# Patient Record
Sex: Female | Born: 1960 | Race: White | Hispanic: No | Smoking: Never smoker
Health system: Southern US, Community
[De-identification: ages and names within clinical notes are randomized; demographics above are authoritative.]

## PROBLEM LIST (undated history)

## (undated) DIAGNOSIS — C44101 Unspecified malignant neoplasm of skin of unspecified eyelid, including canthus: Secondary | ICD-10-CM

## (undated) DIAGNOSIS — O039 Complete or unspecified spontaneous abortion without complication: Secondary | ICD-10-CM

## (undated) DIAGNOSIS — IMO0002 Reserved for concepts with insufficient information to code with codable children: Secondary | ICD-10-CM

## (undated) DIAGNOSIS — R87619 Unspecified abnormal cytological findings in specimens from cervix uteri: Principal | ICD-10-CM

## (undated) HISTORY — DX: Complete or unspecified spontaneous abortion without complication: O03.9

## (undated) HISTORY — PX: APPENDECTOMY: SHX54

## (undated) HISTORY — DX: Unspecified abnormal cytological findings in specimens from cervix uteri: R87.619

## (undated) HISTORY — DX: Reserved for concepts with insufficient information to code with codable children: IMO0002

## (undated) HISTORY — DX: Unspecified malignant neoplasm of skin of unspecified eyelid, including canthus: C44.101

---

## 2007-11-08 ENCOUNTER — Emergency Department: Payer: Self-pay | Admitting: Emergency Medicine

## 2007-11-09 ENCOUNTER — Emergency Department: Payer: Self-pay | Admitting: Internal Medicine

## 2009-11-26 ENCOUNTER — Ambulatory Visit: Payer: Self-pay | Admitting: Obstetrics and Gynecology

## 2009-12-24 ENCOUNTER — Ambulatory Visit: Payer: Self-pay | Admitting: Obstetrics and Gynecology

## 2010-12-08 ENCOUNTER — Ambulatory Visit (INDEPENDENT_AMBULATORY_CARE_PROVIDER_SITE_OTHER): Payer: Managed Care, Other (non HMO) | Admitting: Obstetrics and Gynecology

## 2010-12-08 DIAGNOSIS — Z01419 Encounter for gynecological examination (general) (routine) without abnormal findings: Secondary | ICD-10-CM

## 2010-12-08 DIAGNOSIS — Z1272 Encounter for screening for malignant neoplasm of vagina: Secondary | ICD-10-CM

## 2010-12-09 NOTE — Assessment & Plan Note (Signed)
Carmen Griffith, MADRID NO.:  [id number]  MEDICAL RECORD NO.:  [id number]          PATIENT TYPE:  LOCATION:  [facility] at [facility]           FACILITY:  PHYSICIAN:  Catalina Antigua, MD          DATE OF BIRTH:  DATE OF SERVICE:  12/08/2010                                 CLINIC NOTE  This is a 50 year old G3, P 1-0-2-1 with LMP of November 23, 2010, who presents today for annual exam.  The patient is currently without any complaints, no abnormal bleeding, discharge, or pelvic pain.  PAST MEDICAL HISTORY:  She denies.  PAST SURGICAL HISTORY:  She has had an appendectomy in 1971.  PAST OBSTETRICAL HISTORY:  She has had one full-term vaginal delivery and two miscarriages.  PAST GYNECOLOGICAL HISTORY:  She denies any cyst, fibroids, or history of abnormal Pap smears.  SOCIAL HISTORY:  She denies drinking, smoking, or use of illicit drugs.  FAMILY HISTORY:  Significant for diabetes and hypertension and heart disease.  No history of malignancy in the family.  She is not currently taking any medications.  Her allergies are to BENADRYL.  REVIEW OF SYSTEMS:  Otherwise within normal limits.  PHYSICAL EXAMINATION:  VITAL SIGNS:  Her blood pressure is 101/72. Pulse of 74, weight of 129 pounds, height of 5 feet 5 inches. LUNGS:  Clear to auscultation bilaterally. HEART:  Regular rate and rhythm. BREASTS:  Equal in size, nontender.  No expressible nipple discharge. No palpable masses or lymphadenopathy.  No skin dimpling. ABDOMEN:  Soft, nontender, nondistended. PELVIC:  Normal-appearing external genitalia and normal-appearing vaginal mucosa and cervix.  No abnormal bleeding or discharge.  Bimanual exam shows small anteverted uterus.  No palpable adnexal masses or tenderness.  ASSESSMENT AND PLAN:  This is a 50 year old who presents today for annual exam.  Pap smear was performed.  A referral for routine screening mammogram was also provided.  The patient will be  contacted with any abnormal results.  The patient was also advised to start taking multivitamins with calcium supplementation.  The patient will return in a year or p.r.n.          ______________________________ Catalina Antigua, MD    PC/MEDQ  D:  12/08/2010  T:  12/09/2010  Job:  [id number]

## 2010-12-22 NOTE — Assessment & Plan Note (Signed)
NAMELAPORSHA, GREALISH NO.:  [id number]   MEDICAL RECORD NO.:  [id number]          PATIENT TYPE:  POB   LOCATION:  [facility] at [facility]         FACILITY:  [facility]   PHYSICIAN:  Catalina Antigua, MD     DATE OF BIRTH:  06/21/1961   DATE OF SERVICE:  11/26/2009                                  CLINIC NOTE   This is a 50 year old G3, P 1-0-2-1 with LMP on November 03, 2009 who  presents today for annual exam and also evaluation of right breast  tenderness.  She has noticed for the past few months.  The patient  denies any abnormal bleeding or discharge or any pelvic pain.   She denies any past medical history.   PAST SURGICAL HISTORY:  She has had an appendectomy in 1971.   PAST OBSTETRICAL HISTORY:  She has had 1 vaginal delivery and 2  miscarriages.   PAST GYNECOLOGIC HISTORY:  She denies any history of abnormal Pap smear,  ovarian cyst, or fibroids or history of STDs.  Her last Pap smear was in  2008 and her last mammogram was also in 2008.  The patient states that  her OB/GYN has recently retired and she has already established herself  with a new Dosia Yodice.   SOCIAL HISTORY:  She denies alcohol abuse, smoking, or the use of  illicit drugs.   FAMILY HISTORY:  Significant for diabetes and heart disease in her  father as well as high blood pressure in her father, otherwise coronary  artery disease in her maternal and paternal grandparents.   She is not currently taking any medication.   She reports an allergy to BENADRYL.   REVIEW OF SYSTEMS:  Otherwise within normal limits.   PHYSICAL EXAMINATION:  VITAL SIGNS:  On physical exam, her blood  pressure is 110/76, pulse of 67, weight of 131 pounds, height of 5 feet  5 inches.  LUNGS:  Clear to auscultation bilaterally.  HEART:  Regular rate and rhythm.  BREASTS:  Equal in size, nontender, no expressible nipple discharge.  No  skin dimpling.  No palpable lymphadenopathy.  No palpable masses on the  left breast,  but palpable multiple nodules on the midline of the right  breast and somewhat consistency to fibrocystic changes.  ABDOMEN:  Soft, nontender, nondistended.  PELVIC:  She had normal-appearing external genitalia, normal-appearing  vagina, and cervix.  No abnormal bleeding or discharge.  Bimanual exam,  small anteverted uterus.  No palpable adnexal masses or tenderness.   ASSESSMENT AND PLAN:  This is a 50 year old G3, P 1-0-2-1, who is here  for annual exam.  Pap smear was performed.  The patient was provided  with a referral for mammogram.  The patient will be contacted with any  abnormal results and will return in a year or p.r.n.           ______________________________  Catalina Antigua, MD     PC/MEDQ  D:  11/26/2009  T:  11/26/2009  Job:  [id number]

## 2011-01-20 ENCOUNTER — Ambulatory Visit: Payer: Self-pay | Admitting: Obstetrics and Gynecology

## 2012-01-18 ENCOUNTER — Ambulatory Visit (INDEPENDENT_AMBULATORY_CARE_PROVIDER_SITE_OTHER): Payer: BC Managed Care – PPO | Admitting: Obstetrics & Gynecology

## 2012-01-18 ENCOUNTER — Encounter: Payer: Self-pay | Admitting: Obstetrics & Gynecology

## 2012-01-18 VITALS — BP 102/71 | HR 62 | Ht 65.0 in | Wt 135.0 lb

## 2012-01-18 DIAGNOSIS — Z124 Encounter for screening for malignant neoplasm of cervix: Secondary | ICD-10-CM

## 2012-01-18 DIAGNOSIS — Z01419 Encounter for gynecological examination (general) (routine) without abnormal findings: Secondary | ICD-10-CM

## 2012-01-18 NOTE — Patient Instructions (Signed)
Preventive Care for Adults, Female A healthy lifestyle and preventive care can promote health and wellness. Preventive health guidelines for women include the following key practices.  A routine yearly physical is a good way to check with your caregiver about your health and preventive screening. It is a chance to share any concerns and updates on your health, and to receive a thorough exam.   Visit your dentist for a routine exam and preventive care every 6 months. Brush your teeth twice a day and floss once a day. Good oral hygiene prevents tooth decay and gum disease.   The frequency of eye exams is based on your age, health, family medical history, use of contact lenses, and other factors. Follow your caregiver's recommendations for frequency of eye exams.   Eat a healthy diet. Foods like vegetables, fruits, whole grains, low-fat dairy products, and lean protein foods contain the nutrients you need without too many calories. Decrease your intake of foods high in solid fats, added sugars, and salt. Eat the right amount of calories for you.Get information about a proper diet from your caregiver, if necessary.   Regular physical exercise is one of the most important things you can do for your health. Most adults should get at least 150 minutes of moderate-intensity exercise (any activity that increases your heart rate and causes you to sweat) each week. In addition, most adults need muscle-strengthening exercises on 2 or more days a week.   Maintain a healthy weight. The body mass index (BMI) is a screening tool to identify possible weight problems. It provides an estimate of body fat based on height and weight. Your caregiver can help determine your BMI, and can help you achieve or maintain a healthy weight.For adults 20 years and older:   A BMI below 18.5 is considered underweight.   A BMI of 18.5 to 24.9 is normal.   A BMI of 25 to 29.9 is considered overweight.   A BMI of 30 and above  is considered obese.   Maintain normal blood lipids and cholesterol levels by exercising and minimizing your intake of saturated fat. Eat a balanced diet with plenty of fruit and vegetables. Blood tests for lipids and cholesterol should begin at age 20 and be repeated every 5 years. If your lipid or cholesterol levels are high, you are over 50, or you are at high risk for heart disease, you may need your cholesterol levels checked more frequently.Ongoing high lipid and cholesterol levels should be treated with medicines if diet and exercise are not effective.   If you smoke, find out from your caregiver how to quit. If you do not use tobacco, do not start.   If you are pregnant, do not drink alcohol. If you are breastfeeding, be very cautious about drinking alcohol. If you are not pregnant and choose to drink alcohol, do not exceed 1 drink per day. One drink is considered to be 12 ounces (355 mL) of beer, 5 ounces (148 mL) of wine, or 1.5 ounces (44 mL) of liquor.   Avoid use of street drugs. Do not share needles with anyone. Ask for help if you need support or instructions about stopping the use of drugs.   High blood pressure causes heart disease and increases the risk of stroke. Your blood pressure should be checked at least every 1 to 2 years. Ongoing high blood pressure should be treated with medicines if weight loss and exercise are not effective.   If you are 55 to 49   years old, ask your caregiver if you should take aspirin to prevent strokes.   Diabetes screening involves taking a blood sample to check your fasting blood sugar level. This should be done once every 3 years, after age 45, if you are within normal weight and without risk factors for diabetes. Testing should be considered at a younger age or be carried out more frequently if you are overweight and have at least 1 risk factor for diabetes.   Breast cancer screening is essential preventive care for women. You should practice  "breast self-awareness." This means understanding the normal appearance and feel of your breasts and may include breast self-examination. Any changes detected, no matter how small, should be reported to a caregiver. Women in their 20s and 30s should have a clinical breast exam (CBE) by a caregiver as part of a regular health exam every 1 to 3 years. After age 40, women should have a CBE every year. Starting at age 40, women should consider having a mammography (breast X-ray test) every year. Women who have a family history of breast cancer should talk to their caregiver about genetic screening. Women at a high risk of breast cancer should talk to their caregivers about having magnetic resonance imaging (MRI) and a mammography every year.   The Pap test is a screening test for cervical cancer. A Pap test can show cell changes on the cervix that might become cervical cancer if left untreated. A Pap test is a procedure in which cells are obtained and examined from the lower end of the uterus (cervix).   Women should have a Pap test starting at age 21.   Between ages 21 and 29, Pap tests should be repeated every 2 years.   Beginning at age 30, you should have a Pap test every 3 years as long as the past 3 Pap tests have been normal.   Some women have medical problems that increase the chance of getting cervical cancer. Talk to your caregiver about these problems. It is especially important to talk to your caregiver if a new problem develops soon after your last Pap test. In these cases, your caregiver may recommend more frequent screening and Pap tests.   The above recommendations are the same for women who have or have not gotten the vaccine for human papillomavirus (HPV).   If you had a hysterectomy for a problem that was not cancer or a condition that could lead to cancer, then you no longer need Pap tests. Even if you no longer need a Pap test, a regular exam is a good idea to make sure no other  problems are starting.   If you are between ages 65 and 70, and you have had normal Pap tests going back 10 years, you no longer need Pap tests. Even if you no longer need a Pap test, a regular exam is a good idea to make sure no other problems are starting.   If you have had past treatment for cervical cancer or a condition that could lead to cancer, you need Pap tests and screening for cancer for at least 20 years after your treatment.   If Pap tests have been discontinued, risk factors (such as a new sexual partner) need to be reassessed to determine if screening should be resumed.   The HPV test is an additional test that may be used for cervical cancer screening. The HPV test looks for the virus that can cause the cell changes on the cervix.   The cells collected during the Pap test can be tested for HPV. The HPV test could be used to screen women aged 30 years and older, and should be used in women of any age who have unclear Pap test results. After the age of 30, women should have HPV testing at the same frequency as a Pap test.   Colorectal cancer can be detected and often prevented. Most routine colorectal cancer screening begins at the age of 50 and continues through age 75. However, your caregiver may recommend screening at an earlier age if you have risk factors for colon cancer. On a yearly basis, your caregiver may provide home test kits to check for hidden blood in the stool. Use of a small camera at the end of a tube, to directly examine the colon (sigmoidoscopy or colonoscopy), can detect the earliest forms of colorectal cancer. Talk to your caregiver about this at age 50, when routine screening begins. Direct examination of the colon should be repeated every 5 to 10 years through age 75, unless early forms of pre-cancerous polyps or small growths are found.   Hepatitis C blood testing is recommended for all people born from 1945 through 1965 and any individual with known risks for  hepatitis C.   Practice safe sex. Use condoms and avoid high-risk sexual practices to reduce the spread of sexually transmitted infections (STIs). STIs include gonorrhea, chlamydia, syphilis, trichomonas, herpes, HPV, and human immunodeficiency virus (HIV). Herpes, HIV, and HPV are viral illnesses that have no cure. They can result in disability, cancer, and death. Sexually active women aged 25 and younger should be checked for chlamydia. Older women with new or multiple partners should also be tested for chlamydia. Testing for other STIs is recommended if you are sexually active and at increased risk.   Osteoporosis is a disease in which the bones lose minerals and strength with aging. This can result in serious bone fractures. The risk of osteoporosis can be identified using a bone density scan. Women ages 65 and over and women at risk for fractures or osteoporosis should discuss screening with their caregivers. Ask your caregiver whether you should take a calcium supplement or vitamin D to reduce the rate of osteoporosis.   Menopause can be associated with physical symptoms and risks. Hormone replacement therapy is available to decrease symptoms and risks. You should talk to your caregiver about whether hormone replacement therapy is right for you.   Use sunscreen with sun protection factor (SPF) of 30 or more. Apply sunscreen liberally and repeatedly throughout the day. You should seek shade when your shadow is shorter than you. Protect yourself by wearing long sleeves, pants, a wide-brimmed hat, and sunglasses year round, whenever you are outdoors.   Once a month, do a whole body skin exam, using a mirror to look at the skin on your back. Notify your caregiver of new moles, moles that have irregular borders, moles that are larger than a pencil eraser, or moles that have changed in shape or color.   Stay current with required immunizations.   Influenza. You need a dose every fall (or winter). The  composition of the flu vaccine changes each year, so being vaccinated once is not enough.   Pneumococcal polysaccharide. You need 1 to 2 doses if you smoke cigarettes or if you have certain chronic medical conditions. You need 1 dose at age 65 (or older) if you have never been vaccinated.   Tetanus, diphtheria, pertussis (Tdap, Td). Get 1 dose of   Tdap vaccine if you are younger than age 65, are over 65 and have contact with an infant, are a healthcare worker, are pregnant, or simply want to be protected from whooping cough. After that, you need a Td booster dose every 10 years. Consult your caregiver if you have not had at least 3 tetanus and diphtheria-containing shots sometime in your life or have a deep or dirty wound.   HPV. You need this vaccine if you are a woman age 26 or younger. The vaccine is given in 3 doses over 6 months.   Measles, mumps, rubella (MMR). You need at least 1 dose of MMR if you were born in 1957 or later. You may also need a second dose.   Meningococcal. If you are age 19 to 21 and a first-year college student living in a residence hall, or have one of several medical conditions, you need to get vaccinated against meningococcal disease. You may also need additional booster doses.   Zoster (shingles). If you are age 60 or older, you should get this vaccine.   Varicella (chickenpox). If you have never had chickenpox or you were vaccinated but received only 1 dose, talk to your caregiver to find out if you need this vaccine.   Hepatitis A. You need this vaccine if you have a specific risk factor for hepatitis A virus infection or you simply wish to be protected from this disease. The vaccine is usually given as 2 doses, 6 to 18 months apart.   Hepatitis B. You need this vaccine if you have a specific risk factor for hepatitis B virus infection or you simply wish to be protected from this disease. The vaccine is given in 3 doses, usually over 6 months.  Preventive Services /  Frequency Ages 40 to 64  Blood pressure check.** / Every 1 to 2 years.   Lipid and cholesterol check.** / Every 5 years beginning at age 20.   Clinical breast exam.** / Every year after age 40.   Mammogram.** / Every year beginning at age 40 and continuing for as long as you are in good health. Consult with your caregiver.   Pap test.** / Every 3 years starting at age 30 through age 65 or 70 with a history of 3 consecutive normal Pap tests.   HPV screening.** / Every 3 years from ages 30 through ages 65 to 70 with a history of 3 consecutive normal Pap tests.   Fecal occult blood test (FOBT) of stool. / Every year beginning at age 50 and continuing until age 75. You may not need to do this test if you get a colonoscopy every 10 years.   Flexible sigmoidoscopy or colonoscopy.** / Every 5 years for a flexible sigmoidoscopy or every 10 years for a colonoscopy beginning at age 50 and continuing until age 75.   Hepatitis C blood test.** / For all people born from 1945 through 1965 and any individual with known risks for hepatitis C.   Skin self-exam. / Monthly.   Influenza immunization.** / Every year.   Pneumococcal polysaccharide immunization.** / 1 to 2 doses if you smoke cigarettes or if you have certain chronic medical conditions.   Tetanus, diphtheria, pertussis (Tdap, Td) immunization.** / A one-time dose of Tdap vaccine. After that, you need a Td booster dose every 10 years.   Measles, mumps, rubella (MMR) immunization. / You need at least 1 dose of MMR if you were born in 1957 or later. You may also need a   second dose.   Varicella immunization.** / Consult your caregiver.   Meningococcal immunization.** / Consult your caregiver.   Hepatitis A immunization.** / Consult your caregiver. 2 doses, 6 to 18 months apart.   Hepatitis B immunization.** / Consult your caregiver. 3 doses, usually over 6 months.  ** Family history and personal history of risk and conditions may change  your caregiver's recommendations. Document Released: 09/21/2001 Document Revised: 07/15/2011 Document Reviewed: 12/21/2010 [employer] Patient Information 2012 [employer]. 

## 2012-01-18 NOTE — Progress Notes (Signed)
  Subjective:     Carmen Griffith is a 51 y.o. G3, P 1-0-2-1 who presents today for annual exam.  The patient denies any abnormal bleeding or discharge or any pelvic pain.   She denies any past medical history.   PAST SURGICAL HISTORY: She has had an appendectomy in 1971.   PAST OBSTETRICAL HISTORY: She has had 1 vaginal delivery and 2 miscarriages.   PAST GYNECOLOGIC HISTORY: She denies any history of abnormal Pap smear, ovarian cyst, or fibroids or history of STDs.   SOCIAL HISTORY: She denies alcohol abuse, smoking, or the use of  illicit drugs.   FAMILY HISTORY: Significant for diabetes and heart disease in her father as well as high blood pressure in her father, otherwise coronary  artery disease in her maternal and paternal grandparents.   She is currently taking vitamins.  She reports an allergy to BENADRYL.   REVIEW OF SYSTEMS: Otherwise within normal limits.  The following portions of the patient's history were reviewed and updated as appropriate: allergies, current medications, past family history, past medical history, past social history, past surgical history and problem list.   Objective:   Blood pressure 102/71, pulse 62, height 5\' 5"  (1.651 m), weight 135 lb (61.236 kg), last menstrual period 01/14/2012. GENERAL: Well-developed, well-nourished female in no acute distress.  HEENT: Normocephalic, atraumatic. Sclerae anicteric.  NECK: Supple. Normal thyroid.  LUNGS: Clear to auscultation bilaterally.  HEART: Regular rate and rhythm. BREASTS: Symmetric with everted nipples. No masses, skin changes, nipple drainage, or lymphadenopathy. ABDOMEN: Soft, nontender, nondistended. No organomegaly. PELVIC: Normal external female genitalia. Vagina is pink and rugated. Small amount of blood in vault, patient is at the end of her menstrual period. Normal cervix contour. Pap smear obtained. Uterus is normal in size. No adnexal mass or tenderness.  EXTREMITIES: No cyanosis,  clubbing, or edema, 2+ distal pulses.   Assessment:    Healthy female exam.      Plan:    Follow up pap smear. Mammogram will be scheduled. Patient to return for lipid panel check Other preventative health maintenance issues addressed.

## 2012-01-18 NOTE — Progress Notes (Signed)
Patient is here for routine exam, no complaints.

## 2012-01-24 ENCOUNTER — Other Ambulatory Visit (INDEPENDENT_AMBULATORY_CARE_PROVIDER_SITE_OTHER): Payer: BC Managed Care – PPO | Admitting: Gynecology

## 2012-01-24 DIAGNOSIS — Z0000 Encounter for general adult medical examination without abnormal findings: Secondary | ICD-10-CM

## 2012-01-24 LAB — CBC
Hemoglobin: 13.7 g/dL (ref 12.0–15.0)
MCH: 31.4 pg (ref 26.0–34.0)
Platelets: 323 10*3/uL (ref 150–400)
RBC: 4.36 MIL/uL (ref 3.87–5.11)
WBC: 5.2 10*3/uL (ref 4.0–10.5)

## 2012-01-24 LAB — COMPREHENSIVE METABOLIC PANEL WITH GFR
ALT: 15 U/L (ref 0–35)
AST: 18 U/L (ref 0–37)
Albumin: 4 g/dL (ref 3.5–5.2)
Alkaline Phosphatase: 68 U/L (ref 39–117)
BUN: 15 mg/dL (ref 6–23)
CO2: 26 meq/L (ref 19–32)
Calcium: 8.9 mg/dL (ref 8.4–10.5)
Chloride: 106 meq/L (ref 96–112)
Creat: 0.78 mg/dL (ref 0.50–1.10)
Glucose, Bld: 88 mg/dL (ref 70–99)
Potassium: 4.8 meq/L (ref 3.5–5.3)
Sodium: 140 meq/L (ref 135–145)
Total Bilirubin: 0.5 mg/dL (ref 0.3–1.2)
Total Protein: 6.7 g/dL (ref 6.0–8.3)

## 2012-01-24 LAB — TSH: TSH: 1.98 u[IU]/mL (ref 0.350–4.500)

## 2012-01-24 LAB — LIPID PANEL
Cholesterol: 182 mg/dL (ref 0–200)
HDL: 64 mg/dL
LDL Cholesterol: 101 mg/dL — ABNORMAL HIGH (ref 0–99)
Total CHOL/HDL Ratio: 2.8 ratio
Triglycerides: 83 mg/dL
VLDL: 17 mg/dL (ref 0–40)

## 2012-02-16 ENCOUNTER — Ambulatory Visit: Payer: Self-pay | Admitting: Obstetrics & Gynecology

## 2012-02-16 ENCOUNTER — Encounter: Payer: Self-pay | Admitting: Obstetrics & Gynecology

## 2013-03-28 ENCOUNTER — Ambulatory Visit (INDEPENDENT_AMBULATORY_CARE_PROVIDER_SITE_OTHER): Payer: BC Managed Care – PPO | Admitting: Obstetrics and Gynecology

## 2013-03-28 ENCOUNTER — Encounter: Payer: Self-pay | Admitting: Obstetrics and Gynecology

## 2013-03-28 VITALS — BP 118/69 | HR 62 | Ht 65.0 in | Wt 132.0 lb

## 2013-03-28 DIAGNOSIS — Z01419 Encounter for gynecological examination (general) (routine) without abnormal findings: Secondary | ICD-10-CM

## 2013-03-28 DIAGNOSIS — Z124 Encounter for screening for malignant neoplasm of cervix: Secondary | ICD-10-CM

## 2013-03-28 DIAGNOSIS — Z1151 Encounter for screening for human papillomavirus (HPV): Secondary | ICD-10-CM

## 2013-03-28 NOTE — Progress Notes (Signed)
  Subjective:     Carmen Griffith is a 52 y.o. female [phone] with LMP 03/22/2013 and BMI 21 who is here for a comprehensive physical exam. The patient reports no problems. Patient continues to have regular cycles. She is sexually active without issues using vasectomy for contraception. Patient is enrolled in exercise regimen.  History   Social History  . Marital Status: Married    Spouse Name: N/A    Number of Children: N/A  . Years of Education: N/A   Occupational History  . Not on file.   Social History Main Topics  . Smoking status: Never Smoker   . Smokeless tobacco: Never Used  . Alcohol Use: No  . Drug Use: No  . Sexual Activity: Yes    Partners: Male   Other Topics Concern  . Not on file   Social History Narrative  . No narrative on file   Health Maintenance  Topic Date Due  . Tetanus/tdap  08/31/1981  . Mammogram  08/31/2012  . Colonoscopy  08/31/2012  . Influenza Vaccine  04/09/2013  . Pap Smear  01/18/2015   Past Medical History  Diagnosis Date  . Miscarriage   . Abnormal Pap smear    Past Surgical History  Procedure Laterality Date  . Appendectomy     Family History  Problem Relation Age of Onset  . Diabetes Father   . Hypertension Father   . Heart disease Maternal Grandfather   . Heart disease Paternal Grandfather    History  Substance Use Topics  . Smoking status: Never Smoker   . Smokeless tobacco: Never Used  . Alcohol Use: No       Review of Systems A comprehensive review of systems was negative.   Objective:      GENERAL: Well-developed, well-nourished female in no acute distress.  HEENT: Normocephalic, atraumatic. Sclerae anicteric.  NECK: Supple. Normal thyroid.  LUNGS: Clear to auscultation bilaterally.  HEART: Regular rate and rhythm. BREASTS: Symmetric in size. No palpable masses or lymphadenopathy, skin changes, or nipple drainage. ABDOMEN: Soft, nontender, nondistended. No organomegaly. PELVIC: Normal external female  genitalia. Vagina is pink and rugated.  Normal discharge. Normal appearing cervix. Uterus is normal in size. No adnexal mass or tenderness. EXTREMITIES: No cyanosis, clubbing, or edema, 2+ distal pulses.    Assessment:    Healthy female exam.      Plan:    Pap smear collected Mammogram referral provided Patient advised to perform monthly self breast and vulva exams Patient will be informed of any abnormal results RTC in 1 year or prn See After Visit Summary for Counseling Recommendations

## 2013-03-28 NOTE — Patient Instructions (Signed)
Preventive Care for Adults, Female A healthy lifestyle and preventive care can promote health and wellness. Preventive health guidelines for women include the following key practices.  A routine yearly physical is a good way to check with your caregiver about your health and preventive screening. It is a chance to share any concerns and updates on your health, and to receive a thorough exam.  Visit your dentist for a routine exam and preventive care every 6 months. Brush your teeth twice a day and floss once a day. Good oral hygiene prevents tooth decay and gum disease.  The frequency of eye exams is based on your age, health, family medical history, use of contact lenses, and other factors. Follow your caregiver's recommendations for frequency of eye exams.  Eat a healthy diet. Foods like vegetables, fruits, whole grains, low-fat dairy products, and lean protein foods contain the nutrients you need without too many calories. Decrease your intake of foods high in solid fats, added sugars, and salt. Eat the right amount of calories for you.Get information about a proper diet from your caregiver, if necessary.  Regular physical exercise is one of the most important things you can do for your health. Most adults should get at least 150 minutes of moderate-intensity exercise (any activity that increases your heart rate and causes you to sweat) each week. In addition, most adults need muscle-strengthening exercises on 2 or more days a week.  Maintain a healthy weight. The body mass index (BMI) is a screening tool to identify possible weight problems. It provides an estimate of body fat based on height and weight. Your caregiver can help determine your BMI, and can help you achieve or maintain a healthy weight.For adults 20 years and older:  A BMI below 18.5 is considered underweight.  A BMI of 18.5 to 24.9 is normal.  A BMI of 25 to 29.9 is considered overweight.  A BMI of 30 and above is  considered obese.  Maintain normal blood lipids and cholesterol levels by exercising and minimizing your intake of saturated fat. Eat a balanced diet with plenty of fruit and vegetables. Blood tests for lipids and cholesterol should begin at age 20 and be repeated every 5 years. If your lipid or cholesterol levels are high, you are over 50, or you are at high risk for heart disease, you may need your cholesterol levels checked more frequently.Ongoing high lipid and cholesterol levels should be treated with medicines if diet and exercise are not effective.  If you smoke, find out from your caregiver how to quit. If you do not use tobacco, do not start.  If you are pregnant, do not drink alcohol. If you are breastfeeding, be very cautious about drinking alcohol. If you are not pregnant and choose to drink alcohol, do not exceed 1 drink per day. One drink is considered to be 12 ounces (355 mL) of beer, 5 ounces (148 mL) of wine, or 1.5 ounces (44 mL) of liquor.  Avoid use of street drugs. Do not share needles with anyone. Ask for help if you need support or instructions about stopping the use of drugs.  High blood pressure causes heart disease and increases the risk of stroke. Your blood pressure should be checked at least every 1 to 2 years. Ongoing high blood pressure should be treated with medicines if weight loss and exercise are not effective.  If you are 55 to 50 years old, ask your caregiver if you should take aspirin to prevent strokes.  Diabetes   screening involves taking a blood sample to check your fasting blood sugar level. This should be done once every 3 years, after age 45, if you are within normal weight and without risk factors for diabetes. Testing should be considered at a younger age or be carried out more frequently if you are overweight and have at least 1 risk factor for diabetes.  Breast cancer screening is essential preventive care for women. You should practice "breast  self-awareness." This means understanding the normal appearance and feel of your breasts and may include breast self-examination. Any changes detected, no matter how small, should be reported to a caregiver. Women in their 20s and 30s should have a clinical breast exam (CBE) by a caregiver as part of a regular health exam every 1 to 3 years. After age 40, women should have a CBE every year. Starting at age 40, women should consider having a mammography (breast X-ray test) every year. Women who have a family history of breast cancer should talk to their caregiver about genetic screening. Women at a high risk of breast cancer should talk to their caregivers about having magnetic resonance imaging (MRI) and a mammography every year.  The Pap test is a screening test for cervical cancer. A Pap test can show cell changes on the cervix that might become cervical cancer if left untreated. A Pap test is a procedure in which cells are obtained and examined from the lower end of the uterus (cervix).  Women should have a Pap test starting at age 21.  Between ages 21 and 29, Pap tests should be repeated every 2 years.  Beginning at age 30, you should have a Pap test every 3 years as long as the past 3 Pap tests have been normal.  Some women have medical problems that increase the chance of getting cervical cancer. Talk to your caregiver about these problems. It is especially important to talk to your caregiver if a new problem develops soon after your last Pap test. In these cases, your caregiver may recommend more frequent screening and Pap tests.  The above recommendations are the same for women who have or have not gotten the vaccine for human papillomavirus (HPV).  If you had a hysterectomy for a problem that was not cancer or a condition that could lead to cancer, then you no longer need Pap tests. Even if you no longer need a Pap test, a regular exam is a good idea to make sure no other problems are  starting.  If you are between ages 65 and 70, and you have had normal Pap tests going back 10 years, you no longer need Pap tests. Even if you no longer need a Pap test, a regular exam is a good idea to make sure no other problems are starting.  If you have had past treatment for cervical cancer or a condition that could lead to cancer, you need Pap tests and screening for cancer for at least 20 years after your treatment.  If Pap tests have been discontinued, risk factors (such as a new sexual partner) need to be reassessed to determine if screening should be resumed.  The HPV test is an additional test that may be used for cervical cancer screening. The HPV test looks for the virus that can cause the cell changes on the cervix. The cells collected during the Pap test can be tested for HPV. The HPV test could be used to screen women aged 30 years and older, and should   be used in women of any age who have unclear Pap test results. After the age of 30, women should have HPV testing at the same frequency as a Pap test.  Colorectal cancer can be detected and often prevented. Most routine colorectal cancer screening begins at the age of 50 and continues through age 75. However, your caregiver may recommend screening at an earlier age if you have risk factors for colon cancer. On a yearly basis, your caregiver may provide home test kits to check for hidden blood in the stool. Use of a small camera at the end of a tube, to directly examine the colon (sigmoidoscopy or colonoscopy), can detect the earliest forms of colorectal cancer. Talk to your caregiver about this at age 50, when routine screening begins. Direct examination of the colon should be repeated every 5 to 10 years through age 75, unless early forms of pre-cancerous polyps or small growths are found.  Hepatitis C blood testing is recommended for all people born from 1945 through 1965 and any individual with known risks for hepatitis C.  Practice  safe sex. Use condoms and avoid high-risk sexual practices to reduce the spread of sexually transmitted infections (STIs). STIs include gonorrhea, chlamydia, syphilis, trichomonas, herpes, HPV, and human immunodeficiency virus (HIV). Herpes, HIV, and HPV are viral illnesses that have no cure. They can result in disability, cancer, and death. Sexually active women aged 25 and younger should be checked for chlamydia. Older women with new or multiple partners should also be tested for chlamydia. Testing for other STIs is recommended if you are sexually active and at increased risk.  Osteoporosis is a disease in which the bones lose minerals and strength with aging. This can result in serious bone fractures. The risk of osteoporosis can be identified using a bone density scan. Women ages 65 and over and women at risk for fractures or osteoporosis should discuss screening with their caregivers. Ask your caregiver whether you should take a calcium supplement or vitamin D to reduce the rate of osteoporosis.  Menopause can be associated with physical symptoms and risks. Hormone replacement therapy is available to decrease symptoms and risks. You should talk to your caregiver about whether hormone replacement therapy is right for you.  Use sunscreen with sun protection factor (SPF) of 30 or more. Apply sunscreen liberally and repeatedly throughout the day. You should seek shade when your shadow is shorter than you. Protect yourself by wearing long sleeves, pants, a wide-brimmed hat, and sunglasses year round, whenever you are outdoors.  Once a month, do a whole body skin exam, using a mirror to look at the skin on your back. Notify your caregiver of new moles, moles that have irregular borders, moles that are larger than a pencil eraser, or moles that have changed in shape or color.  Stay current with required immunizations.  Influenza. You need a dose every fall (or winter). The composition of the flu vaccine  changes each year, so being vaccinated once is not enough.  Pneumococcal polysaccharide. You need 1 to 2 doses if you smoke cigarettes or if you have certain chronic medical conditions. You need 1 dose at age 65 (or older) if you have never been vaccinated.  Tetanus, diphtheria, pertussis (Tdap, Td). Get 1 dose of Tdap vaccine if you are younger than age 65, are over 65 and have contact with an infant, are a healthcare worker, are pregnant, or simply want to be protected from whooping cough. After that, you need a Td   booster dose every 10 years. Consult your caregiver if you have not had at least 3 tetanus and diphtheria-containing shots sometime in your life or have a deep or dirty wound.  HPV. You need this vaccine if you are a woman age 26 or younger. The vaccine is given in 3 doses over 6 months.  Measles, mumps, rubella (MMR). You need at least 1 dose of MMR if you were born in 1957 or later. You may also need a second dose.  Meningococcal. If you are age 19 to 21 and a first-year college student living in a residence hall, or have one of several medical conditions, you need to get vaccinated against meningococcal disease. You may also need additional booster doses.  Zoster (shingles). If you are age 60 or older, you should get this vaccine.  Varicella (chickenpox). If you have never had chickenpox or you were vaccinated but received only 1 dose, talk to your caregiver to find out if you need this vaccine.  Hepatitis A. You need this vaccine if you have a specific risk factor for hepatitis A virus infection or you simply wish to be protected from this disease. The vaccine is usually given as 2 doses, 6 to 18 months apart.  Hepatitis B. You need this vaccine if you have a specific risk factor for hepatitis B virus infection or you simply wish to be protected from this disease. The vaccine is given in 3 doses, usually over 6 months. Preventive Services / Frequency Ages 40 to 64  Blood  pressure check.** / Every 1 to 2 years.  Lipid and cholesterol check.** / Every 5 years beginning at age 20.  Clinical breast exam.** / Every year after age 40.  Mammogram.** / Every year beginning at age 40 and continuing for as long as you are in good health. Consult with your caregiver.  Pap test.** / Every 3 years starting at age 30 through age 65 or 70 with a history of 3 consecutive normal Pap tests.  HPV screening.** / Every 3 years from ages 30 through ages 65 to 70 with a history of 3 consecutive normal Pap tests.  Fecal occult blood test (FOBT) of stool. / Every year beginning at age 50 and continuing until age 75. You may not need to do this test if you get a colonoscopy every 10 years.  Flexible sigmoidoscopy or colonoscopy.** / Every 5 years for a flexible sigmoidoscopy or every 10 years for a colonoscopy beginning at age 50 and continuing until age 75.  Hepatitis C blood test.** / For all people born from 1945 through 1965 and any individual with known risks for hepatitis C.  Skin self-exam. / Monthly.  Influenza immunization.** / Every year.  Pneumococcal polysaccharide immunization.** / 1 to 2 doses if you smoke cigarettes or if you have certain chronic medical conditions.  Tetanus, diphtheria, pertussis (Tdap, Td) immunization.** / A one-time dose of Tdap vaccine. After that, you need a Td booster dose every 10 years.  Measles, mumps, rubella (MMR) immunization. / You need at least 1 dose of MMR if you were born in 1957 or later. You may also need a second dose.  Varicella immunization.** / Consult your caregiver.  Meningococcal immunization.** / Consult your caregiver.  Hepatitis A immunization.** / Consult your caregiver. 2 doses, 6 to 18 months apart.  Hepatitis B immunization.** / Consult your caregiver. 3 doses, usually over 6 months. Ages 65 and over  Blood pressure check.** / Every 1 to 2 years.    Lipid and cholesterol check.** / Every 5 years beginning  at age 20.  Clinical breast exam.** / Every year after age 40.  Mammogram.** / Every year beginning at age 40 and continuing for as long as you are in good health. Consult with your caregiver.  Pap test.** / Every 3 years starting at age 30 through age 65 or 70 with a 3 consecutive normal Pap tests. Testing can be stopped between 65 and 70 with 3 consecutive normal Pap tests and no abnormal Pap or HPV tests in the past 10 years.  HPV screening.** / Every 3 years from ages 30 through ages 65 or 70 with a history of 3 consecutive normal Pap tests. Testing can be stopped between 65 and 70 with 3 consecutive normal Pap tests and no abnormal Pap or HPV tests in the past 10 years.  Fecal occult blood test (FOBT) of stool. / Every year beginning at age 50 and continuing until age 75. You may not need to do this test if you get a colonoscopy every 10 years.  Flexible sigmoidoscopy or colonoscopy.** / Every 5 years for a flexible sigmoidoscopy or every 10 years for a colonoscopy beginning at age 50 and continuing until age 75.  Hepatitis C blood test.** / For all people born from 1945 through 1965 and any individual with known risks for hepatitis C.  Osteoporosis screening.** / A one-time screening for women ages 65 and over and women at risk for fractures or osteoporosis.  Skin self-exam. / Monthly.  Influenza immunization.** / Every year.  Pneumococcal polysaccharide immunization.** / 1 dose at age 65 (or older) if you have never been vaccinated.  Tetanus, diphtheria, pertussis (Tdap, Td) immunization. / A one-time dose of Tdap vaccine if you are over 65 and have contact with an infant, are a healthcare worker, or simply want to be protected from whooping cough. After that, you need a Td booster dose every 10 years.  Varicella immunization.** / Consult your caregiver.  Meningococcal immunization.** / Consult your caregiver.  Hepatitis A immunization.** / Consult your caregiver. 2 doses, 6 to  18 months apart.  Hepatitis B immunization.** / Check with your caregiver. 3 doses, usually over 6 months. ** Family history and personal history of risk and conditions may change your caregiver's recommendations. Document Released: 09/21/2001 Document Revised: 10/18/2011 Document Reviewed: 12/21/2010 [employer] Patient Information 2014 [employer]  

## 2013-04-12 ENCOUNTER — Ambulatory Visit: Payer: Self-pay

## 2013-08-09 DIAGNOSIS — C44101 Unspecified malignant neoplasm of skin of unspecified eyelid, including canthus: Secondary | ICD-10-CM

## 2013-08-09 HISTORY — DX: Unspecified malignant neoplasm of skin of unspecified eyelid, including canthus: C44.101

## 2014-05-01 ENCOUNTER — Encounter: Payer: Self-pay | Admitting: Obstetrics and Gynecology

## 2014-05-01 ENCOUNTER — Ambulatory Visit (INDEPENDENT_AMBULATORY_CARE_PROVIDER_SITE_OTHER): Payer: BC Managed Care – PPO | Admitting: Obstetrics and Gynecology

## 2014-05-01 VITALS — BP 102/65 | HR 65 | Ht 63.5 in | Wt 136.6 lb

## 2014-05-01 DIAGNOSIS — Z1151 Encounter for screening for human papillomavirus (HPV): Secondary | ICD-10-CM | POA: Diagnosis not present

## 2014-05-01 DIAGNOSIS — Z124 Encounter for screening for malignant neoplasm of cervix: Secondary | ICD-10-CM

## 2014-05-01 DIAGNOSIS — Z01419 Encounter for gynecological examination (general) (routine) without abnormal findings: Secondary | ICD-10-CM

## 2014-05-01 NOTE — Progress Notes (Signed)
  Subjective:     Carmen Griffith is a 53 y.o. female [phone] with LMP 04/23/2014 and BMI 23 who is here for a comprehensive physical exam. The patient reports no problems. She continues to have monthly menses lasting 3-7 days. Patient is sexually active without any concerns. She denies any issues with urinary incontinence.  History   Social History  . Marital Status: Married    Spouse Name: N/A    Number of Children: N/A  . Years of Education: N/A   Occupational History  . Not on file.   Social History Main Topics  . Smoking status: Never Smoker   . Smokeless tobacco: Never Used  . Alcohol Use: No  . Drug Use: No  . Sexual Activity: Yes    Partners: Male    Birth Control/ Protection: Condom   Other Topics Concern  . Not on file   Social History Narrative  . No narrative on file   Health Maintenance  Topic Date Due  . Tetanus/tdap  08/31/1981  . Mammogram  08/31/2012  . Colonoscopy  08/31/2012  . Influenza Vaccine  03/09/2014  . Pap Smear  03/28/2016   Past Medical History  Diagnosis Date  . Miscarriage   . Abnormal Pap smear   . 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 Appendectomy     Family History  Problem Relation Age of Onset  . Diabetes Father   . Hypertension Father   . Heart disease Maternal Grandfather   . Heart disease Paternal Grandfather        Review of Systems A comprehensive review of systems was negative.   Objective:      GENERAL: Well-developed, well-nourished female in no acute distress.  HEENT: Normocephalic, atraumatic. Sclerae anicteric.  NECK: Supple. Normal thyroid.  LUNGS: Clear to auscultation bilaterally.  HEART: Regular rate and rhythm. BREASTS: Symmetric in size. No palpable masses or lymphadenopathy, skin changes, or nipple drainage. ABDOMEN: Soft, nontender, nondistended. No organomegaly. PELVIC: Normal external female genitalia. Vagina is  pink and rugated.  Normal discharge. Normal appearing cervix. Uterus is normal in size. No adnexal mass or tenderness. EXTREMITIES: No cyanosis, clubbing, or edema, 2+ distal pulses.    Assessment:    Healthy female exam.      Plan:    pap smear collected Referral for screening mammography provided Patient advised to perfom monthly self breast and vulva exams See After Visit Summary for Counseling Recommendations

## 2014-05-03 LAB — CYTOLOGY - PAP

## 2014-05-30 ENCOUNTER — Ambulatory Visit: Payer: Self-pay

## 2018-06-14 DIAGNOSIS — R87619 Unspecified abnormal cytological findings in specimens from cervix uteri: Secondary | ICD-10-CM | POA: Insufficient documentation

## 2018-06-14 HISTORY — DX: Unspecified abnormal cytological findings in specimens from cervix uteri: R87.619

## 2018-06-20 ENCOUNTER — Other Ambulatory Visit: Payer: Self-pay | Admitting: Family Medicine

## 2018-06-20 DIAGNOSIS — Z1231 Encounter for screening mammogram for malignant neoplasm of breast: Secondary | ICD-10-CM

## 2018-06-28 ENCOUNTER — Ambulatory Visit
Admission: RE | Admit: 2018-06-28 | Discharge: 2018-06-28 | Disposition: A | Discharge status: Home / Self Care | Payer: BLUE CROSS/BLUE SHIELD | Source: Ambulatory Visit | Attending: Family Medicine | Admitting: Family Medicine

## 2018-06-28 DIAGNOSIS — Z1231 Encounter for screening mammogram for malignant neoplasm of breast: Secondary | ICD-10-CM | POA: Diagnosis present

## 2018-07-03 ENCOUNTER — Encounter: Payer: Self-pay | Admitting: Radiology

## 2018-07-11 ENCOUNTER — Encounter: Payer: Self-pay | Admitting: Obstetrics & Gynecology

## 2018-07-11 ENCOUNTER — Ambulatory Visit (INDEPENDENT_AMBULATORY_CARE_PROVIDER_SITE_OTHER): Payer: BLUE CROSS/BLUE SHIELD | Admitting: Obstetrics & Gynecology

## 2018-07-11 VITALS — BP 102/70 | HR 86 | Wt 140.6 lb

## 2018-07-11 DIAGNOSIS — N939 Abnormal uterine and vaginal bleeding, unspecified: Secondary | ICD-10-CM

## 2018-07-11 DIAGNOSIS — N841 Polyp of cervix uteri: Secondary | ICD-10-CM | POA: Diagnosis not present

## 2018-07-11 DIAGNOSIS — R87619 Unspecified abnormal cytological findings in specimens from cervix uteri: Secondary | ICD-10-CM | POA: Diagnosis not present

## 2018-07-11 DIAGNOSIS — Z3202 Encounter for pregnancy test, result negative: Secondary | ICD-10-CM

## 2018-07-11 LAB — POCT URINE PREGNANCY: Preg Test, Ur: NEGATIVE

## 2018-07-11 NOTE — Patient Instructions (Signed)
Endometrial Biopsy, Care After This sheet gives you information about how to care for yourself after your procedure. Your health care provider may also give you more specific instructions. If you have problems or questions, contact your health care provider. What can I expect after the procedure? After the procedure, it is common to have:  Mild cramping.  A small amount of vaginal bleeding for a few days. This is normal.  Follow these instructions at home:  Take over-the-counter and prescription medicines only as told by your health care provider.  Do not douche, use tampons, or have sexual intercourse until your health care provider approves.  Return to your normal activities as told by your health care provider. Ask your health care provider what activities are safe for you.  Follow instructions from your health care provider about any activity restrictions, such as restrictions on strenuous exercise or heavy lifting. Contact a health care provider if:  You have heavy bleeding, or bleed for longer than 2 days after the procedure.  You have bad smelling discharge from your vagina.  You have a fever or chills.  You have a burning sensation when urinating or you have difficulty urinating.  You have severe pain in your lower abdomen. Get help right away if:  You have severe cramps in your stomach or back.  You pass large blood clots.  Your bleeding increases.  You become weak or light-headed, or you pass out. Summary  After the procedure, it is common to have mild cramping and a small amount of vaginal bleeding for a few days.  Do not douche, use tampons, or have sexual intercourse until your health care provider approves.  Return to your normal activities as told by your health care provider. Ask your health care provider what activities are safe for you. This information is not intended to replace advice given to you by your health care provider. Make sure you discuss any  questions you have with your health care provider. Document Released: 05/16/2013 Document Revised: 08/11/2016 Document Reviewed: 08/11/2016 Elsevier Interactive Patient Education  2017 [employer] Colposcopy, Care After This sheet gives you information about how to care for yourself after your procedure. Your health care provider may also give you more specific instructions. If you have problems or questions, contact your health care provider. What can I expect after the procedure? If you had a colposcopy without a biopsy, you can expect to feel fine right away, but you may have some spotting for a few days. You can go back to your normal activities. If you had a colposcopy with a biopsy, it is common to have:  Soreness and pain. This may last for a few days.  Light-headedness.  Mild vaginal bleeding or dark-colored, grainy discharge. This may last for a few days. The discharge may be due to a solution that was used during the procedure. You may need to wear a sanitary pad during this time.  Spotting for at least 48 hours after the procedure.  Follow these instructions at home:  Take over-the-counter and prescription medicines only as told by your health care provider. Talk with your health care provider about what type of over-the-counter pain medicine and prescription medicine you can start taking again. It is especially important to talk with your health care provider if you take blood-thinning medicine.  Do not drive or use heavy machinery while taking prescription pain medicine.  For at least 3 days after your procedure, or as long as told by your health   care provider, avoid: ? Douching. ? Using tampons. ? Having sexual intercourse.  Continue to use birth control (contraception).  Limit your physical activity for the first day after the procedure as told by your health care provider. Ask your health care provider what activities are safe for you.  It is up to you to get the  results of your procedure. Ask your health care provider, or the department performing the procedure, when your results will be ready.  Keep all follow-up visits as told by your health care provider. This is important. Contact a health care provider if:  You develop a skin rash. Get help right away if:  You are bleeding heavily from your vagina or you are passing blood clots. This includes using more than one sanitary pad per hour for 2 hours in a row.  You have a fever or chills.  You have pelvic pain.  You have abnormal, yellow-colored, or bad-smelling vaginal discharge. This could be a sign of infection.  You have severe pain or cramps in your lower abdomen that do not get better with medicine.  You feel light-headed or dizzy, or you faint. Summary  If you had a colposcopy without a biopsy, you can expect to feel fine right away, but you may have some spotting for a few days. You can go back to your normal activities.  If you had a colposcopy with a biopsy, you may notice mild pain and spotting for 48 hours after the procedure.  Avoid douching, using tampons, and having sexual intercourse for 3 days after the procedure or as long as told by your health care provider.  Contact your health care provider if you have bleeding, severe pain, or signs of infection. This information is not intended to replace advice given to you by your health care provider. Make sure you discuss any questions you have with your health care provider. Document Released: 05/16/2013 Document Revised: 03/12/2016 Document Reviewed: 03/12/2016 Elsevier Interactive Patient Education  2018 [employer]  

## 2018-07-11 NOTE — Progress Notes (Signed)
    GYNECOLOGY OFFICE  PROCEDURE NOTES  57 y.o. F here for colposcopy and endometrial biopsy for "Epithelial Cell Abnormality, Atypical Glandular Cells (AGC), Inflammation Present, Negative high-risk HPV" on pap smear result done on 06/14/2018 at [facility] by her PCP.  Of note, patient reported still having periods, but endorses irregular periods and intermenstrual spotting. Discussed need for further evaluation and surveillance.  The indications for these procedures were reviewed.   Risks of the biopsy including cramping, bleeding, infection, uterine perforation, inadequate specimen and need for additional procedures  were discussed. The patient states she understands and agrees to undergo the recommended procedures today. Consent was signed. Time out was performed. Urine HCG was negative.  Procedure Details Patient was placed in lithotomy position. Cervix viewed with speculum and colposcope after application of acetic acid.  Small (~2 mm) ectocervical polyp noted at external os, patient verbally consented to removal of this lesion. Colposcopy adequate? Yes Acetowhite lesion(s) noted at 2 o'clock; corresponding biopsy obtained.  Polyp was grasped with Kelly forceps, twisted and removed in its entirety.  Endocervical curettings specimen obtained. The cervix was then prepped with Betadine. A single-toothed tenaculum was placed on the anterior lip of the cervix to stabilize it. The 3 mm pipelle was introduced into the endometrial cavity without difficulty to a depth of 7cm, and a a small amount of tissue was obtained after two passes and sent to pathology. The instruments were removed from the patient's vagina. Minimal bleeding from the cervix was noted.  All specimens were labeled and sent to pathology.   The patient tolerated the procedures well (colposcopy with cervical biopsy and curettage, cervical polypectomy, endometrial biopsy). She was given post procedure instructions.  Will  follow up pathology results and manage accordingly; patient will be contacted with results and recommendations. Pelvic ultrasound also ordered for evaluation of her intermenstrual/perimenopausal bleeding.  Routine preventative health maintenance measures emphasized.    Carmen Schneiders, MD, [place] for [facility], [place]

## 2018-07-27 ENCOUNTER — Ambulatory Visit: Payer: BLUE CROSS/BLUE SHIELD | Admitting: Obstetrics & Gynecology

## 2018-07-31 ENCOUNTER — Ambulatory Visit
Admission: RE | Admit: 2018-07-31 | Discharge: 2018-07-31 | Disposition: A | Discharge status: Home / Self Care | Payer: BLUE CROSS/BLUE SHIELD | Source: Ambulatory Visit | Attending: Obstetrics & Gynecology | Admitting: Obstetrics & Gynecology

## 2018-07-31 DIAGNOSIS — D251 Intramural leiomyoma of uterus: Secondary | ICD-10-CM | POA: Insufficient documentation

## 2018-07-31 DIAGNOSIS — R87619 Unspecified abnormal cytological findings in specimens from cervix uteri: Secondary | ICD-10-CM | POA: Insufficient documentation

## 2018-07-31 DIAGNOSIS — N939 Abnormal uterine and vaginal bleeding, unspecified: Secondary | ICD-10-CM | POA: Insufficient documentation

## 2018-08-03 ENCOUNTER — Ambulatory Visit (INDEPENDENT_AMBULATORY_CARE_PROVIDER_SITE_OTHER): Payer: BLUE CROSS/BLUE SHIELD | Admitting: Obstetrics & Gynecology

## 2018-08-03 ENCOUNTER — Encounter: Payer: Self-pay | Admitting: Obstetrics & Gynecology

## 2018-08-03 VITALS — BP 99/63 | HR 65

## 2018-08-03 DIAGNOSIS — N939 Abnormal uterine and vaginal bleeding, unspecified: Secondary | ICD-10-CM

## 2018-08-03 DIAGNOSIS — R87619 Unspecified abnormal cytological findings in specimens from cervix uteri: Secondary | ICD-10-CM

## 2018-08-03 NOTE — Progress Notes (Signed)
GYNECOLOGY OFFICE VISIT NOTE  History:  56 y.o. PMP F here today for follow up results after recent procedures done for evaluation of recent AGC pap and AUB. She denies any abnormal vaginal discharge, bleeding, pelvic pain or other concerns.   Past Medical History:  Diagnosis Date  . Abnormal Pap smear   . Atypical glandular cells on cervical Pap smear on 06/14/18 06/14/2018   Done at outside office, sent to [facility] for further evaluation on 12/3> Colposcopy and endometrial biopsy done>>  . Miscarriage   . 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 APPENDECTOMY      The following portions of the patient's history were reviewed and updated as appropriate: allergies, current medications, past family history, past medical history, past social history, past surgical history and problem list.   Health Maintenance:  Normal mammogram on 06/28/2018.   Review of Systems:  Pertinent items noted in HPI and remainder of comprehensive ROS otherwise negative.  Objective:  Physical Exam BP 99/63   Pulse 65  CONSTITUTIONAL: Well-developed, well-nourished female in no acute distress.  HEENT:  Normocephalic, atraumatic. External right and left ear normal. No scleral icterus.  NECK: Normal range of motion, supple, no masses noted on observation SKIN: No rash noted. Not diaphoretic. No erythema. No pallor. MUSCULOSKELETAL: Normal range of motion. No edema noted. NEUROLOGIC: Alert and oriented to person, place, and time. Normal muscle tone coordination. No cranial nerve deficit noted. PSYCHIATRIC: Normal mood and affect. Normal behavior. Normal judgment and thought content. CARDIOVASCULAR: Normal heart rate noted RESPIRATORY: Effort and breath sounds normal, no problems with respiration noted ABDOMEN: No masses noted. No other overt distention noted.   PELVIC: Deferred  Labs and Imaging 07/11/2018 Pathology  Diagnosis 1. Cervix, biopsy, 2 o'clock -BENIGN SQUAMOUS MUCOSA. -NO CERVICAL TRANSFORMATION ZONE MUCOSA PRESENT. -NO DYSPLASIA OR MALIGNANCY IDENTIFIED. 2. Cervix, polyp - ENDOCERVICAL POLYP WITH MARKED CHRONIC INFLAMMATION AND EROSION. - NO DYSPLASIA OR MALIGNANCY IDENTIFIED. 3. Endocervix, curettage - MUCOID MATERIAL AND BLOOD WITH RARE BENIGN ENDOCERVICAL CELLS. - NO DYSPLASIA OR MALIGNANCY IDENTIFIED. 4. Endometrium, biopsy - ATROPHIC ENDOMETRIUM WITH BREAKDOWN. - NO HYPERPLASIA, ATYPIA OR MALIGNANCY IDENTIFIED.  US Pelvic Complete With Transvaginal  Result Date: 07/31/2018 CLINICAL DATA:  Abnormal Peri menopausal uterine bleeding. EXAM: TRANSABDOMINAL AND TRANSVAGINAL ULTRASOUND OF PELVIS TECHNIQUE: Both transabdominal and transvaginal ultrasound examinations of the pelvis were performed. Transabdominal technique was performed for global imaging of the pelvis including uterus, ovaries, adnexal regions, and pelvic cul-de-sac. It was necessary to proceed with endovaginal exam following the transabdominal exam to visualize the uterus, ovaries and adnexa. COMPARISON:  None FINDINGS: Uterus Measurements: 6.3 x 3.4 x 5.1 cm = volume: 57 mL. 2.6 cm right intramural fibroid. Endometrium Thickness: 2 mm in thickness.  No focal abnormality visualized. Right ovary Measurements: Not visualized.  No adnexal mass seen. Left ovary Measurements: 1.7 x 0.9 x 1.2 cm = volume: 1.0 mL. Normal appearance/no adnexal mass. Other findings No abnormal free fluid. IMPRESSION: 2.6 cm right intramural fundal fibroid. No acute findings. Electronically Signed   By: Rolm Baptise M.D.   On: 07/31/2018 16:57    Assessment & Plan:  1. Atypical glandular cells on cervical Pap smear on 06/14/18 Benign pathology for colposcopy biopsies, ECC and EMB. As per ASCCP guidelines, need to repeat pap and HPV test at 12 and 24 months  2. Abnormal uterine bleeding (AUB) Due to atrophy, patient reassured. Will continue to observe  closely.   Routine preventative health maintenance measures emphasized. Please refer to After Visit Summary for other counseling recommendations.   Return for any gynecologic concerns.   Total face-to-face time with patient: 15 minutes.  Over 50% of encounter was spent on counseling and coordination of care.   Verita Schneiders, MD, [place] for [employer] [facility], [place]

## 2018-08-03 NOTE — Patient Instructions (Signed)
Return to clinic for any scheduled appointments or for any gynecologic concerns as needed.   

## 2019-03-20 ENCOUNTER — Encounter: Payer: Self-pay | Admitting: Radiology

## 2019-05-31 ENCOUNTER — Other Ambulatory Visit: Payer: Self-pay | Admitting: Family Medicine

## 2019-05-31 DIAGNOSIS — Z1231 Encounter for screening mammogram for malignant neoplasm of breast: Secondary | ICD-10-CM

## 2019-06-19 ENCOUNTER — Ambulatory Visit (INDEPENDENT_AMBULATORY_CARE_PROVIDER_SITE_OTHER): Payer: BC Managed Care – PPO | Admitting: Obstetrics & Gynecology

## 2019-06-19 ENCOUNTER — Encounter: Payer: Self-pay | Admitting: Obstetrics & Gynecology

## 2019-06-19 ENCOUNTER — Other Ambulatory Visit: Payer: Self-pay

## 2019-06-19 VITALS — BP 108/71 | HR 72 | Wt 122.2 lb

## 2019-06-19 DIAGNOSIS — Z01419 Encounter for gynecological examination (general) (routine) without abnormal findings: Secondary | ICD-10-CM

## 2019-06-19 DIAGNOSIS — Z23 Encounter for immunization: Secondary | ICD-10-CM

## 2019-06-19 DIAGNOSIS — R87619 Unspecified abnormal cytological findings in specimens from cervix uteri: Secondary | ICD-10-CM

## 2019-06-19 DIAGNOSIS — Z124 Encounter for screening for malignant neoplasm of cervix: Secondary | ICD-10-CM

## 2019-06-19 DIAGNOSIS — Z1151 Encounter for screening for human papillomavirus (HPV): Secondary | ICD-10-CM | POA: Diagnosis not present

## 2019-06-19 MED ORDER — TETANUS-DIPHTH-ACELL PERTUSSIS 5-2.5-18.5 LF-MCG/0.5 IM SUSP
0.5000 mL | Freq: Once | INTRAMUSCULAR | Status: AC
  Administered 2019-06-19: 0.5 mL via INTRAMUSCULAR

## 2019-06-19 NOTE — Progress Notes (Signed)
GYNECOLOGY ANNUAL PREVENTATIVE CARE ENCOUNTER NOTE  History:     Carmen Griffith is a 58 y.o. PMP female here for a routine annual gynecologic exam.  Current complaints: periodic hot flashes, not debilitating, does not want intervention.  Denies abnormal vaginal bleeding, discharge, pelvic pain, problems with intercourse or other gynecologic concerns.    Gynecologic History No LMP recorded (lmp unknown). Patient is postmenopausal. Last Pap: 06/14/18. Results were: abnormal with AGC Last mammogram: 07/02/19. Results were: normal  Obstetric History OB History  No obstetric history on file.    Past Medical History:  Diagnosis Date  . Abnormal Pap smear   . Atypical glandular cells on cervical Pap smear on 06/14/18 06/14/2018   Done at outside office, sent to [facility] for further evaluation on 12/3> Colposcopy and endometrial biopsy done>>  . Miscarriage   . 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 APPENDECTOMY      Current Outpatient Medications on File Prior to Visit  Medication Sig Dispense Refill  . Multiple Vitamin (MULTIVITAMIN) capsule Take 1 capsule by mouth daily.     No current facility-administered medications on file prior to visit.     Allergies  Allergen Reactions  . Benadryl [Diphenhydramine Hcl]     Social History:  reports that she has never smoked. She has never used smokeless tobacco. She reports that she does not drink alcohol or use drugs.  Family History  Problem Relation Age of Onset  . Diabetes Father   . Hypertension Father   . Heart disease Maternal Grandfather   . Heart disease Paternal Grandfather     The following portions of the patient's history were reviewed and updated as appropriate: allergies, current medications, past family history, past medical history, past social history, past surgical history and problem list.  Review of Systems Pertinent items  noted in HPI and remainder of comprehensive ROS otherwise negative.  Physical Exam:  BP 108/71   Pulse 72   Wt 122 lb 3.2 oz (55.4 kg)   LMP  (LMP Unknown)   BMI 21.31 kg/m  CONSTITUTIONAL: Well-developed, well-nourished female in no acute distress.  HENT:  Normocephalic, atraumatic, External right and left ear normal. Oropharynx is clear and moist EYES: Conjunctivae and EOM are normal. Pupils are equal, round, and reactive to light. No scleral icterus.  NECK: Normal range of motion, supple, no masses.  Normal thyroid.  SKIN: Skin is warm and dry. No rash noted. Not diaphoretic. No erythema. No pallor. MUSCULOSKELETAL: Normal range of motion. No tenderness.  No cyanosis, clubbing, or edema.  2+ distal pulses. NEUROLOGIC: Alert and oriented to person, place, and time. Normal reflexes, muscle tone coordination. No cranial nerve deficit noted. PSYCHIATRIC: Normal mood and affect. Normal behavior. Normal judgment and thought content. CARDIOVASCULAR: Normal heart rate noted, regular rhythm RESPIRATORY: Clear to auscultation bilaterally. Effort and breath sounds normal, no problems with respiration noted. BREASTS: Symmetric in size. No masses, skin changes, nipple drainage, or lymphadenopathy. ABDOMEN: Soft, normal bowel sounds, no distention noted.  No tenderness, rebound or guarding.  PELVIC: Normal appearing external genitalia; normal appearing vaginal mucosa with mild atrophy and cervix.  No abnormal discharge noted.  Pap smear obtained.  Normal uterine size, no other palpable masses, no uterine or adnexal tenderness.   Assessment and Plan:    1. Need for Tdap vaccination Care gaps noted need for Tdap. Patient agreed to get it. - Tdap (BOOSTRIX) injection  0.5 mL  2. Atypical glandular cells on cervical Pap smear on 06/14/18 3. Well woman exam with routine gynecological exam Repeat pap done today. Will follow up results and manage accordingly. - Cytology - PAP( [place]) Mammogram  scheduled Routine preventative health maintenance measures emphasized. Please refer to After Visit Summary for other counseling recommendations.      Verita Schneiders, MD, [place] for [employer] [facility], [place]

## 2019-06-19 NOTE — Patient Instructions (Signed)

## 2019-06-20 ENCOUNTER — Encounter: Payer: Self-pay | Admitting: Obstetrics & Gynecology

## 2019-06-22 LAB — CYTOLOGY - PAP
Comment: NEGATIVE
Diagnosis: NEGATIVE
High risk HPV: NEGATIVE

## 2019-07-02 ENCOUNTER — Ambulatory Visit
Admission: RE | Admit: 2019-07-02 | Discharge: 2019-07-02 | Disposition: A | Discharge status: Home / Self Care | Payer: BC Managed Care – PPO | Source: Ambulatory Visit | Attending: Family Medicine | Admitting: Family Medicine

## 2019-07-02 DIAGNOSIS — Z1231 Encounter for screening mammogram for malignant neoplasm of breast: Secondary | ICD-10-CM | POA: Insufficient documentation

## 2020-04-21 ENCOUNTER — Encounter: Payer: Self-pay | Admitting: Radiology

## 2020-05-21 ENCOUNTER — Other Ambulatory Visit: Payer: Self-pay | Admitting: Family Medicine

## 2020-05-21 ENCOUNTER — Other Ambulatory Visit: Payer: Self-pay | Admitting: Obstetrics & Gynecology

## 2020-05-21 DIAGNOSIS — Z1231 Encounter for screening mammogram for malignant neoplasm of breast: Secondary | ICD-10-CM

## 2020-06-26 ENCOUNTER — Other Ambulatory Visit (HOSPITAL_COMMUNITY)
Admission: RE | Admit: 2020-06-26 | Discharge: 2020-06-26 | Disposition: A | Discharge status: Home / Self Care | Payer: BC Managed Care – PPO | Source: Ambulatory Visit | Attending: Obstetrics & Gynecology | Admitting: Obstetrics & Gynecology

## 2020-06-26 ENCOUNTER — Ambulatory Visit (INDEPENDENT_AMBULATORY_CARE_PROVIDER_SITE_OTHER): Payer: BC Managed Care – PPO | Admitting: Obstetrics & Gynecology

## 2020-06-26 ENCOUNTER — Other Ambulatory Visit: Payer: Self-pay

## 2020-06-26 ENCOUNTER — Encounter: Payer: Self-pay | Admitting: Obstetrics & Gynecology

## 2020-06-26 VITALS — BP 102/67 | HR 69 | Ht 64.5 in | Wt 126.0 lb

## 2020-06-26 DIAGNOSIS — R87619 Unspecified abnormal cytological findings in specimens from cervix uteri: Secondary | ICD-10-CM

## 2020-06-26 DIAGNOSIS — Z01419 Encounter for gynecological examination (general) (routine) without abnormal findings: Secondary | ICD-10-CM | POA: Insufficient documentation

## 2020-06-26 NOTE — Progress Notes (Signed)
GYNECOLOGY ANNUAL PREVENTATIVE CARE ENCOUNTER NOTE  History:     Carmen Griffith is a 59 y.o. PMP female here for a routine annual gynecologic exam.  Current complaints: working with PCP to lower her AIC from prediabetic range to normal range.   Denies abnormal vaginal bleeding, discharge, pelvic pain, problems with intercourse or other gynecologic concerns.    Gynecologic History No LMP recorded (lmp unknown). Patient is postmenopausal. Last Pap: 06/2019. Results were: normal with negative HPV. Atypical glandular cells on cervical Pap smear on 06/14/18 with benign colposcopy and endometrial biopsy pathology. Last mammogram: 07/02/2019. Results were: normal   Past Medical History:  Diagnosis Date  . Abnormal Pap smear   . Atypical glandular cells on cervical Pap smear on 06/14/18 06/14/2018   Done at outside office, sent to [facility] for further evaluation on 12/3> Colposcopy and endometrial biopsy done>>  . Miscarriage   . 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 APPENDECTOMY      Current Outpatient Medications on File Prior to Visit  Medication Sig Dispense Refill  . atorvastatin (LIPITOR) 20 MG tablet Take 20 mg by mouth daily.    . Multiple Vitamin (MULTIVITAMIN) capsule Take 1 capsule by mouth daily.     No current facility-administered medications on file prior to visit.    Allergies  Allergen Reactions  . Benadryl [Diphenhydramine Hcl]     Social History:  reports that she has never smoked. She has never used smokeless tobacco. She reports that she does not drink alcohol and does not use drugs.  Family History  Problem Relation Age of Onset  . Diabetes Father   . Hypertension Father   . Heart disease Maternal Grandfather   . Heart disease Paternal Grandfather     The following portions of the patient's history were reviewed and updated as appropriate: allergies, current medications,  past family history, past medical history, past social history, past surgical history and problem list.  Review of Systems Pertinent items noted in HPI and remainder of comprehensive ROS otherwise negative.  Physical Exam:  BP 102/67   Pulse 69   Ht 5' 4.5" (1.638 m)   Wt 126 lb (57.2 kg)   LMP  (LMP Unknown) Comment: LMP 06/2019  BMI 21.29 kg/m  CONSTITUTIONAL: Well-developed, well-nourished female in no acute distress.  HENT:  Normocephalic, atraumatic, External right and left ear normal.  EYES: Conjunctivae and EOM are normal. Pupils are equal, round, and reactive to light. No scleral icterus.  NECK: Normal range of motion, supple, no masses.  Normal thyroid.  SKIN: Skin is warm and dry. No rash noted. Not diaphoretic. No erythema. No pallor. MUSCULOSKELETAL: Normal range of motion. No tenderness.  No cyanosis, clubbing, or edema.  2+ distal pulses. NEUROLOGIC: Alert and oriented to person, place, and time. Normal reflexes, muscle tone coordination.  PSYCHIATRIC: Normal mood and affect. Normal behavior. Normal judgment and thought content. CARDIOVASCULAR: Normal heart rate noted, regular rhythm RESPIRATORY: Clear to auscultation bilaterally. Effort and breath sounds normal, no problems with respiration noted. BREASTS: Symmetric in size. No masses, tenderness, skin changes, nipple drainage, or lymphadenopathy bilaterally. Performed in the presence of a chaperone. ABDOMEN: Soft, no distention noted.  No tenderness, rebound or guarding.  PELVIC: Normal appearing external genitalia and urethral meatus; normal appearing vaginal mucosa and cervix with mild atrophy.  No abnormal discharge noted.  Pap smear obtained.  Normal uterine size, no other palpable  masses, no uterine or adnexal tenderness.  Performed in the presence of a chaperone.   Assessment and Plan:      1. Well woman exam with routine gynecological exam 2. Atypical glandular cells on cervical Pap smear on 06/14/18 - Cytology -  PAP Will follow up results of pap smear and manage accordingly. Mammogram already scheduled Routine preventative health maintenance measures emphasized. Please refer to After Visit Summary for other counseling recommendations.      Verita Schneiders, MD, [place] for [employer] [facility], [place]

## 2020-06-26 NOTE — Patient Instructions (Signed)

## 2020-06-30 LAB — CYTOLOGY - PAP
Comment: NEGATIVE
Diagnosis: NEGATIVE
High risk HPV: NEGATIVE

## 2020-07-08 ENCOUNTER — Other Ambulatory Visit: Payer: Self-pay

## 2020-07-08 ENCOUNTER — Ambulatory Visit
Admission: RE | Admit: 2020-07-08 | Discharge: 2020-07-08 | Disposition: A | Discharge status: Home / Self Care | Payer: BC Managed Care – PPO | Source: Ambulatory Visit | Attending: Obstetrics & Gynecology | Admitting: Obstetrics & Gynecology

## 2020-07-08 DIAGNOSIS — Z1231 Encounter for screening mammogram for malignant neoplasm of breast: Secondary | ICD-10-CM | POA: Insufficient documentation

## 2021-01-15 ENCOUNTER — Other Ambulatory Visit: Payer: Self-pay

## 2021-05-22 ENCOUNTER — Encounter: Payer: Self-pay | Admitting: Radiology

## 2021-05-29 ENCOUNTER — Other Ambulatory Visit: Payer: Self-pay | Admitting: Family Medicine

## 2021-05-29 DIAGNOSIS — Z1231 Encounter for screening mammogram for malignant neoplasm of breast: Secondary | ICD-10-CM

## 2021-07-07 ENCOUNTER — Encounter: Payer: Self-pay | Admitting: Obstetrics & Gynecology

## 2021-07-07 ENCOUNTER — Other Ambulatory Visit (HOSPITAL_COMMUNITY)
Admission: RE | Admit: 2021-07-07 | Discharge: 2021-07-07 | Disposition: A | Discharge status: Home / Self Care | Payer: BC Managed Care – PPO | Source: Ambulatory Visit | Attending: Obstetrics & Gynecology | Admitting: Obstetrics & Gynecology

## 2021-07-07 ENCOUNTER — Ambulatory Visit (INDEPENDENT_AMBULATORY_CARE_PROVIDER_SITE_OTHER): Payer: BC Managed Care – PPO | Admitting: Obstetrics & Gynecology

## 2021-07-07 ENCOUNTER — Other Ambulatory Visit: Payer: Self-pay

## 2021-07-07 VITALS — BP 106/63 | HR 70 | Wt 125.0 lb

## 2021-07-07 DIAGNOSIS — Z01419 Encounter for gynecological examination (general) (routine) without abnormal findings: Secondary | ICD-10-CM | POA: Insufficient documentation

## 2021-07-07 NOTE — Progress Notes (Signed)
GYNECOLOGY ANNUAL PREVENTATIVE CARE ENCOUNTER NOTE  History:     GERLEAN CID is a 60 y.o. PMP female here for a routine annual gynecologic exam.  Current complaints: none.   Not currently sexually active. Denies abnormal vaginal bleeding, discharge, pelvic pain, or other gynecologic concerns.    Gynecologic History No LMP recorded (lmp unknown). Patient is postmenopausal. Contraception: post menopausal status Last Pap: 06/26/2020. Result was normal with negative HPV. Atypical glandular cells on cervical Pap smear on 06/14/18 with benign colposcopy and endometrial biopsy pathology. Normal paps in 2020 and 2021. Last Mammogram: 07/08/2020.  Result was normal Last Colonoscopy: 11/22/2019 at [facility].  Result was normal  Obstetric History OB History  No obstetric history on file.    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     Multiple Vitamin (MULTIVITAMIN) capsule Take 1 capsule by mouth daily.     No current facility-administered medications on file prior to visit.    Allergies  Allergen Reactions   Benadryl [Diphenhydramine Hcl]     Social History:  reports that she has never smoked. She has never used smokeless tobacco. She reports that she does not drink alcohol and does not use drugs.  Family History  Problem Relation Age of Onset   Diabetes Father    Hypertension Father    Heart disease Maternal Grandfather    Heart disease Paternal Grandfather     The  following portions of the patient's history were reviewed and updated as appropriate: allergies, current medications, past family history, past medical history, past social history, past surgical history and problem list.  Review of Systems Pertinent items noted in HPI and remainder of comprehensive ROS otherwise negative.  Physical Exam:  BP 106/63   Pulse 70   Wt 125 lb (56.7 kg)   LMP  (LMP Unknown) Comment: LMP 06/2019  BMI 21.12 kg/m  CONSTITUTIONAL: Well-developed, well-nourished female in no acute distress.  HENT:  Normocephalic, atraumatic, External right and left ear normal.  EYES: Conjunctivae and EOM are normal. Pupils are equal, round, and reactive to light. No scleral icterus.  NECK: Normal range of motion, supple, no masses.  Normal thyroid.  SKIN: Skin is warm and dry. No rash noted. Not diaphoretic. No erythema. No pallor. MUSCULOSKELETAL: Normal range of motion. No tenderness.  No cyanosis, clubbing, or edema. NEUROLOGIC: Alert and oriented to person, place, and time. Normal reflexes, muscle tone coordination.  PSYCHIATRIC: Normal mood and affect. Normal behavior. Normal judgment and thought content. CARDIOVASCULAR: Normal heart rate noted, regular rhythm RESPIRATORY: Clear to auscultation bilaterally. Effort and breath sounds normal, no problems with respiration noted. BREASTS: Symmetric in size. No masses, tenderness, skin changes, nipple drainage, or lymphadenopathy bilaterally. Performed in the presence of a chaperone. ABDOMEN: Soft, no distention noted.  No tenderness, rebound or guarding.  PELVIC: Normal appearing external genitalia and urethral meatus; normal appearing vaginal mucosa and cervix with mild atrophy.  No abnormal discharge noted.  Pap smear obtained.  Normal uterine size, no other palpable masses, no uterine or adnexal tenderness.  Performed in the presence of a chaperone.    Assessment and Plan:    1. Well woman exam with routine gynecological exam -  Cytology - PAP Will follow up results of pap smear and manage accordingly. Mammogram scheduled on 07/09/21 Colon cancer screening is up to date. Routine preventative health maintenance measures emphasized. Please refer to After Visit Summary for other counseling recommendations.      Verita Schneiders, MD, [place] for [employer], [place]

## 2021-07-08 LAB — CYTOLOGY - PAP
Comment: NEGATIVE
Diagnosis: NEGATIVE
High risk HPV: NEGATIVE

## 2021-07-09 ENCOUNTER — Ambulatory Visit
Admission: RE | Admit: 2021-07-09 | Discharge: 2021-07-09 | Disposition: A | Discharge status: Home / Self Care | Payer: BC Managed Care – PPO | Source: Ambulatory Visit | Attending: Family Medicine | Admitting: Family Medicine

## 2021-07-09 ENCOUNTER — Other Ambulatory Visit: Payer: Self-pay

## 2021-07-09 DIAGNOSIS — Z1231 Encounter for screening mammogram for malignant neoplasm of breast: Secondary | ICD-10-CM | POA: Insufficient documentation

## 2022-06-01 ENCOUNTER — Other Ambulatory Visit: Payer: Self-pay | Admitting: Family Medicine

## 2022-06-01 DIAGNOSIS — Z1231 Encounter for screening mammogram for malignant neoplasm of breast: Secondary | ICD-10-CM

## 2022-08-04 ENCOUNTER — Ambulatory Visit
Admission: RE | Admit: 2022-08-04 | Discharge: 2022-08-04 | Disposition: A | Discharge status: Home / Self Care | Payer: BC Managed Care – PPO | Source: Ambulatory Visit | Attending: Family Medicine | Admitting: Family Medicine

## 2022-08-04 DIAGNOSIS — Z1231 Encounter for screening mammogram for malignant neoplasm of breast: Secondary | ICD-10-CM | POA: Insufficient documentation

## 2023-04-30 IMAGING — MG MM DIGITAL SCREENING BILAT W/ TOMO AND CAD
8 series · 8 of 24 positions shown · non-contrast
Comparison: Previous exam(s).

CLINICAL DATA: Screening.

EXAM:
DIGITAL SCREENING BILATERAL MAMMOGRAM WITH TOMOSYNTHESIS AND CAD
TECHNIQUE: Bilateral screening digital craniocaudal and mediolateral oblique
mammograms were obtained. Bilateral screening digital breast
tomosynthesis was performed. The images were evaluated with
computer-aided detection.

[L CC synth-2D]
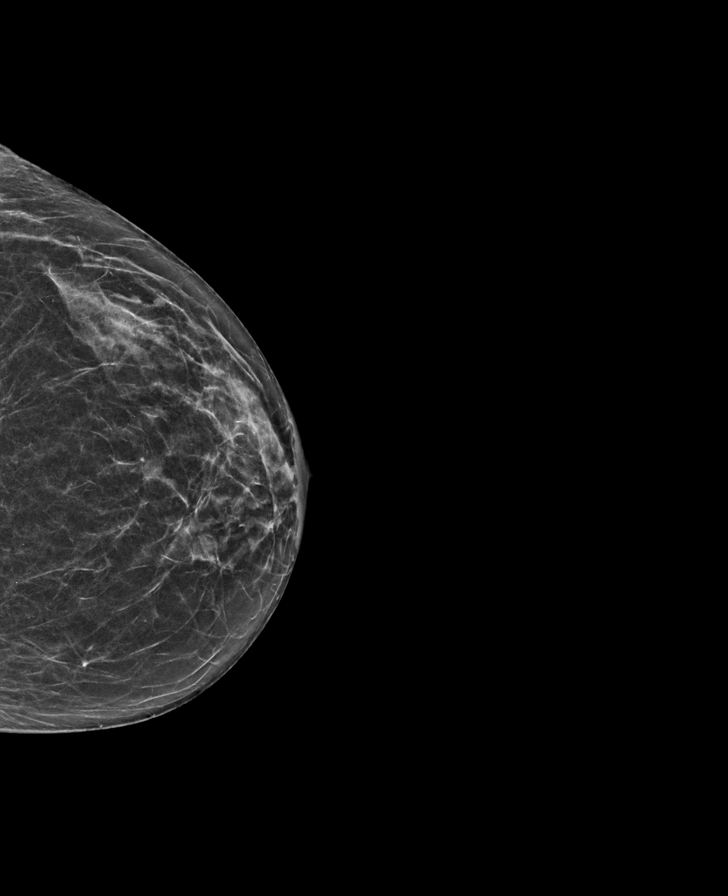

[L MLO synth-2D]
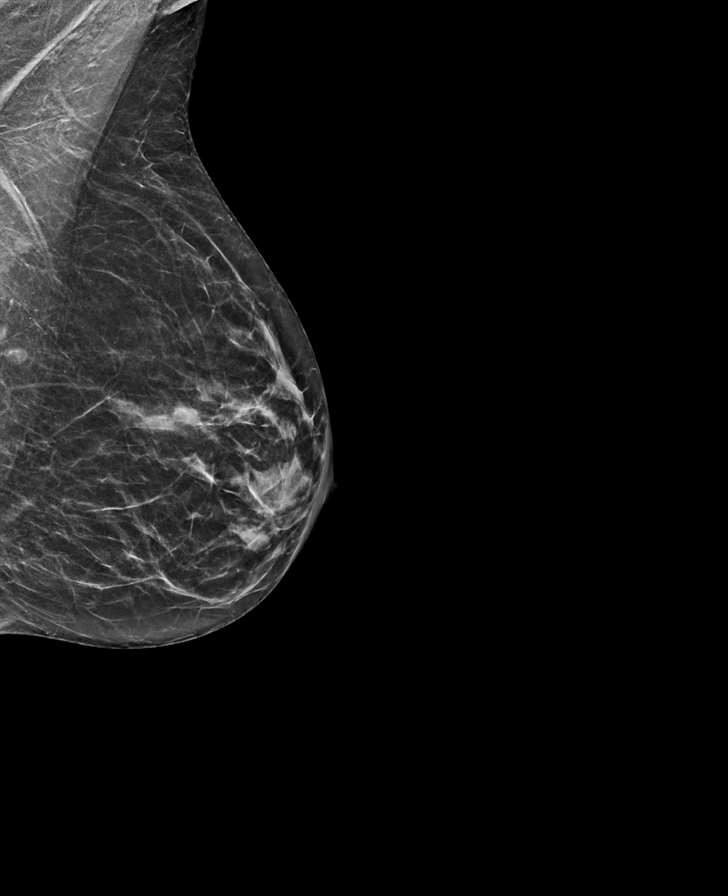

[R MLO synth-2D]
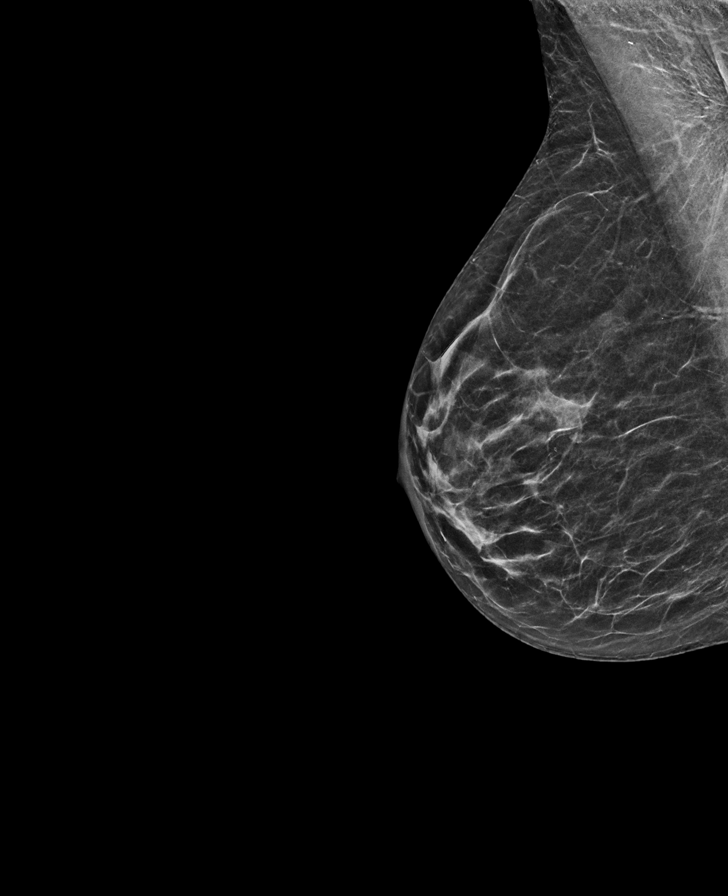

[R CC synth-2D]
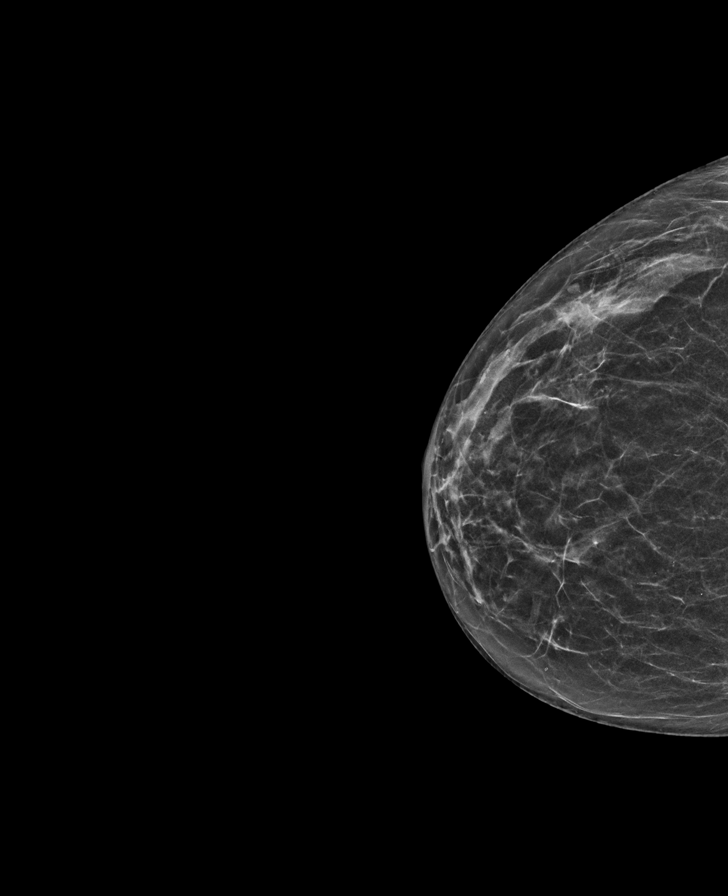

[L CC tomo · tomo slice 27/54.0]
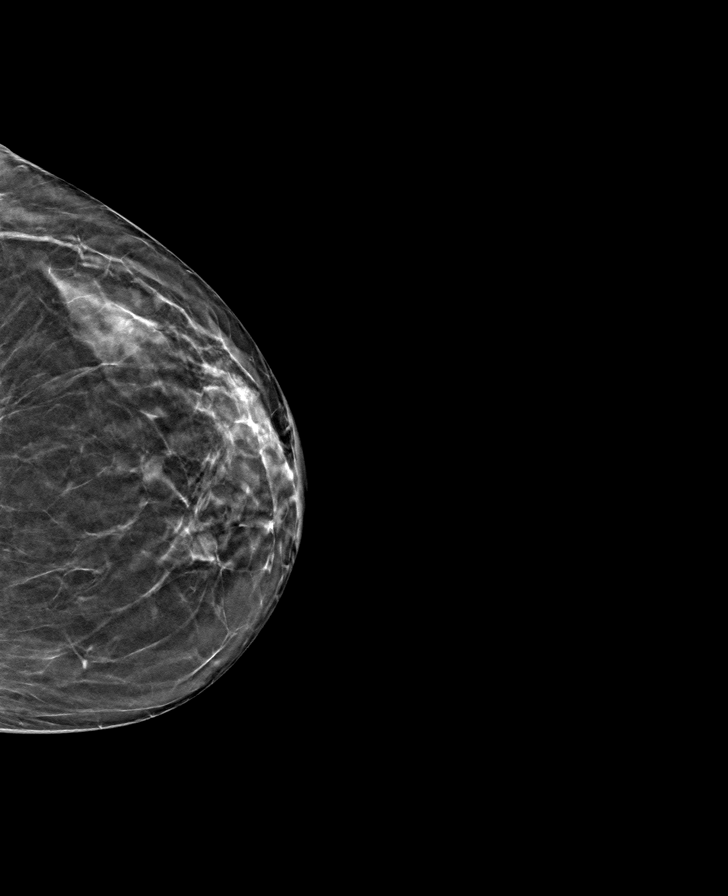

[R MLO tomo · tomo slice 23/46.0]
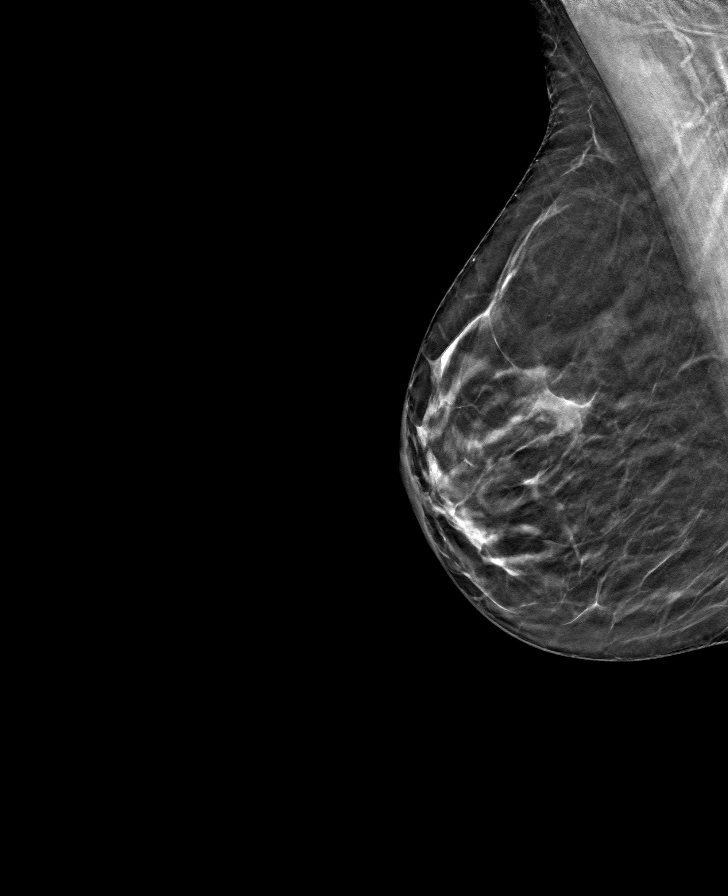

[R CC tomo · tomo slice 27/53.0]
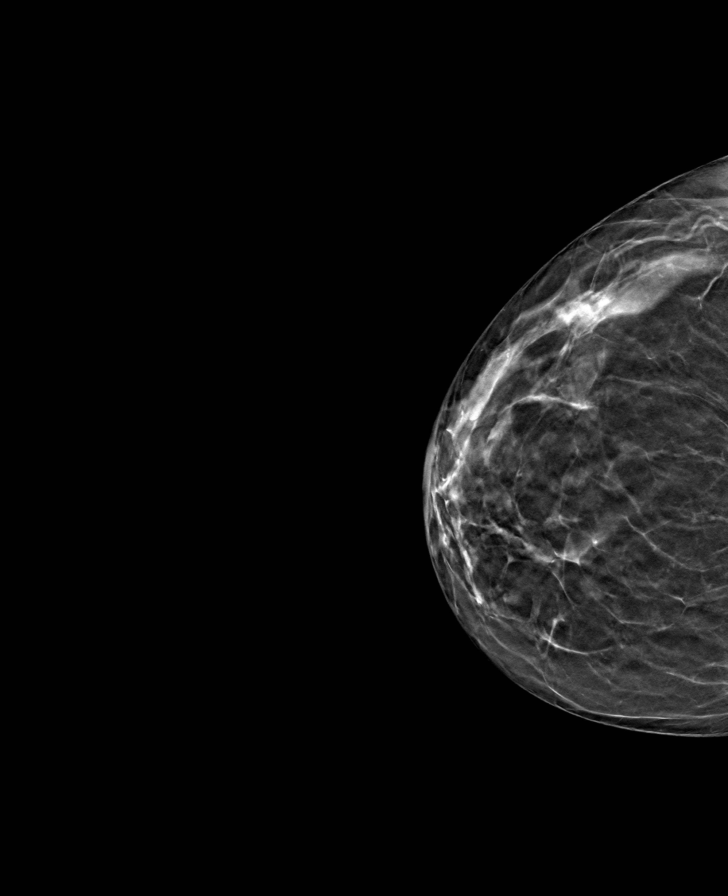

[L MLO tomo · tomo slice 25/49.0]
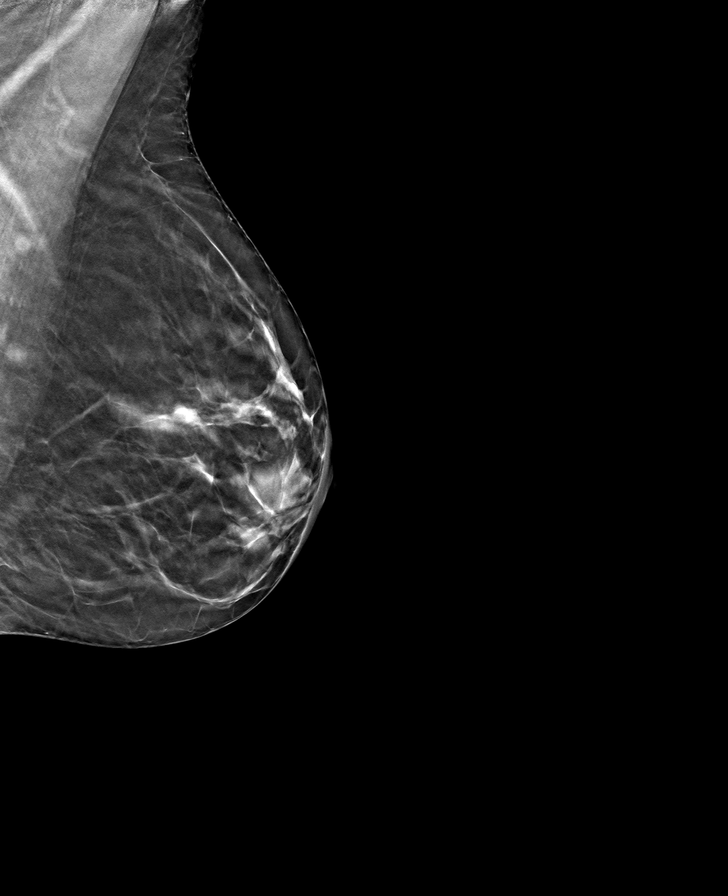

[8 of 24 positions shown; findings below may reference images not displayed]

ACR Breast Density Category b: There are scattered areas of
fibroglandular density.
FINDINGS: There are no findings suspicious for malignancy.
IMPRESSION: No mammographic evidence of malignancy. A result letter of this
screening mammogram will be mailed directly to the patient.

RECOMMENDATION:
Screening mammogram in one year. (Code:51-O-LD2)

BI-RADS CATEGORY  1: Negative.

## 2023-07-20 ENCOUNTER — Other Ambulatory Visit: Payer: Self-pay | Admitting: Family Medicine

## 2023-07-20 DIAGNOSIS — Z1231 Encounter for screening mammogram for malignant neoplasm of breast: Secondary | ICD-10-CM

## 2023-08-25 ENCOUNTER — Ambulatory Visit
Admission: RE | Admit: 2023-08-25 | Discharge: 2023-08-25 | Disposition: A | Discharge status: Home / Self Care | Payer: PRIVATE HEALTH INSURANCE | Source: Ambulatory Visit | Attending: Family Medicine | Admitting: Family Medicine

## 2023-08-25 DIAGNOSIS — Z1231 Encounter for screening mammogram for malignant neoplasm of breast: Secondary | ICD-10-CM | POA: Insufficient documentation

## 2024-08-10 ENCOUNTER — Other Ambulatory Visit: Payer: Self-pay | Admitting: Family Medicine

## 2024-08-10 DIAGNOSIS — Z1231 Encounter for screening mammogram for malignant neoplasm of breast: Secondary | ICD-10-CM

## 2024-08-28 ENCOUNTER — Other Ambulatory Visit: Payer: Self-pay | Admitting: Medical Genetics

## 2024-08-29 ENCOUNTER — Ambulatory Visit
Admission: RE | Admit: 2024-08-29 | Discharge: 2024-08-29 | Disposition: A | Discharge status: Home / Self Care | Payer: PRIVATE HEALTH INSURANCE | Source: Ambulatory Visit | Attending: Family Medicine | Admitting: Family Medicine

## 2024-08-29 DIAGNOSIS — Z1231 Encounter for screening mammogram for malignant neoplasm of breast: Secondary | ICD-10-CM | POA: Insufficient documentation
